# Patient Record
Sex: Female | Born: 1945 | Race: White | Hispanic: No
Health system: Southern US, Community
[De-identification: ages and names within clinical notes are randomized; demographics above are authoritative.]

---

## 2014-01-28 ENCOUNTER — Inpatient Hospital Stay
Admission: AD | Admit: 2014-01-28 | Discharge: 2014-02-19 | Disposition: A | Discharge status: Skilled Nursing Facility | Payer: Self-pay | Source: Ambulatory Visit | Attending: Internal Medicine | Admitting: Internal Medicine

## 2014-01-29 ENCOUNTER — Other Ambulatory Visit (HOSPITAL_COMMUNITY): Payer: Self-pay

## 2014-01-29 LAB — CBC
HCT: 24.6 % — ABNORMAL LOW (ref 36.0–46.0)
Hemoglobin: 8.4 g/dL — ABNORMAL LOW (ref 12.0–15.0)
MCH: 28.1 pg (ref 26.0–34.0)
MCHC: 34.1 g/dL (ref 30.0–36.0)
MCV: 82.3 fL (ref 78.0–100.0)
Platelets: 171 10*3/uL (ref 150–400)
RBC: 2.99 MIL/uL — ABNORMAL LOW (ref 3.87–5.11)
RDW: 14.5 % (ref 11.5–15.5)
WBC: 6.4 10*3/uL (ref 4.0–10.5)

## 2014-01-29 LAB — URINALYSIS, ROUTINE W REFLEX MICROSCOPIC
Bilirubin Urine: NEGATIVE
GLUCOSE, UA: 250 mg/dL — AB
Ketones, ur: 15 mg/dL — AB
Nitrite: NEGATIVE
Protein, ur: NEGATIVE mg/dL
SPECIFIC GRAVITY, URINE: 1.013 (ref 1.005–1.030)
UROBILINOGEN UA: 0.2 mg/dL (ref 0.0–1.0)
pH: 7 (ref 5.0–8.0)

## 2014-01-29 LAB — COMPREHENSIVE METABOLIC PANEL
ALBUMIN: 1.8 g/dL — AB (ref 3.5–5.2)
ALK PHOS: 89 U/L (ref 39–117)
ALT: 8 U/L (ref 0–35)
AST: 14 U/L (ref 0–37)
BUN: 13 mg/dL (ref 6–23)
CO2: 26 mEq/L (ref 19–32)
Calcium: 7.9 mg/dL — ABNORMAL LOW (ref 8.4–10.5)
Chloride: 105 mEq/L (ref 96–112)
Creatinine, Ser: 0.49 mg/dL — ABNORMAL LOW (ref 0.50–1.10)
GFR calc Af Amer: >90 mL/min (ref >90)
GFR calc non Af Amer: >90 mL/min (ref >90)
Glucose, Bld: 234 mg/dL — ABNORMAL HIGH (ref 70–99)
POTASSIUM: 3.6 meq/L — AB (ref 3.7–5.3)
SODIUM: 141 meq/L (ref 137–147)
TOTAL PROTEIN: 6.1 g/dL (ref 6.0–8.3)
Total Bilirubin: 0.3 mg/dL (ref 0.3–1.2)

## 2014-01-29 LAB — PREALBUMIN: Prealbumin: 8 mg/dL — ABNORMAL LOW (ref 17.0–34.0)

## 2014-01-29 LAB — URINE MICROSCOPIC-ADD ON

## 2014-01-29 LAB — TSH: TSH: 3.76 u[IU]/mL (ref 0.350–4.500)

## 2014-01-31 LAB — URINALYSIS, ROUTINE W REFLEX MICROSCOPIC
Bilirubin Urine: NEGATIVE
GLUCOSE, UA: 250 mg/dL — AB
KETONES UR: NEGATIVE mg/dL
Nitrite: NEGATIVE
PROTEIN: 30 mg/dL — AB
Specific Gravity, Urine: 1.014 (ref 1.005–1.030)
Urobilinogen, UA: 1 mg/dL (ref 0.0–1.0)
pH: 6.5 (ref 5.0–8.0)

## 2014-01-31 LAB — URINE MICROSCOPIC-ADD ON

## 2014-02-01 LAB — BASIC METABOLIC PANEL
BUN: 11 mg/dL (ref 6–23)
CHLORIDE: 98 meq/L (ref 96–112)
CO2: 26 meq/L (ref 19–32)
Calcium: 7.7 mg/dL — ABNORMAL LOW (ref 8.4–10.5)
Creatinine, Ser: 0.43 mg/dL — ABNORMAL LOW (ref 0.50–1.10)
GFR calc Af Amer: >90 mL/min (ref >90)
GFR calc non Af Amer: >90 mL/min (ref >90)
Glucose, Bld: 338 mg/dL — ABNORMAL HIGH (ref 70–99)
POTASSIUM: 3.1 meq/L — AB (ref 3.7–5.3)
SODIUM: 135 meq/L — AB (ref 137–147)

## 2014-02-01 LAB — CBC
HCT: 26.9 % — ABNORMAL LOW (ref 36.0–46.0)
Hemoglobin: 9 g/dL — ABNORMAL LOW (ref 12.0–15.0)
MCH: 28 pg (ref 26.0–34.0)
MCHC: 33.5 g/dL (ref 30.0–36.0)
MCV: 83.5 fL (ref 78.0–100.0)
PLATELETS: 193 10*3/uL (ref 150–400)
RBC: 3.22 MIL/uL — ABNORMAL LOW (ref 3.87–5.11)
RDW: 14.9 % (ref 11.5–15.5)
WBC: 4.2 10*3/uL (ref 4.0–10.5)

## 2014-02-01 LAB — URINE CULTURE: Colony Count: >=100000

## 2014-02-02 ENCOUNTER — Other Ambulatory Visit (HOSPITAL_COMMUNITY): Payer: Self-pay

## 2014-02-02 LAB — BASIC METABOLIC PANEL
BUN: 10 mg/dL (ref 6–23)
CO2: 28 mEq/L (ref 19–32)
Calcium: 7.8 mg/dL — ABNORMAL LOW (ref 8.4–10.5)
Chloride: 102 mEq/L (ref 96–112)
Creatinine, Ser: 0.4 mg/dL — ABNORMAL LOW (ref 0.50–1.10)
GLUCOSE: 268 mg/dL — AB (ref 70–99)
POTASSIUM: 3 meq/L — AB (ref 3.7–5.3)
Sodium: 140 mEq/L (ref 137–147)

## 2014-02-03 LAB — CBC
HCT: 24.9 % — ABNORMAL LOW (ref 36.0–46.0)
Hemoglobin: 8.4 g/dL — ABNORMAL LOW (ref 12.0–15.0)
MCH: 28.4 pg (ref 26.0–34.0)
MCHC: 33.7 g/dL (ref 30.0–36.0)
MCV: 84.1 fL (ref 78.0–100.0)
Platelets: 179 10*3/uL (ref 150–400)
RBC: 2.96 MIL/uL — AB (ref 3.87–5.11)
RDW: 15.7 % — ABNORMAL HIGH (ref 11.5–15.5)
WBC: 4.1 10*3/uL (ref 4.0–10.5)

## 2014-02-03 LAB — PHOSPHORUS: PHOSPHORUS: 2.7 mg/dL (ref 2.3–4.6)

## 2014-02-03 LAB — BASIC METABOLIC PANEL
BUN: 10 mg/dL (ref 6–23)
CO2: 28 meq/L (ref 19–32)
Calcium: 8 mg/dL — ABNORMAL LOW (ref 8.4–10.5)
Chloride: 103 mEq/L (ref 96–112)
Creatinine, Ser: 0.39 mg/dL — ABNORMAL LOW (ref 0.50–1.10)
GFR calc Af Amer: >90 mL/min (ref >90)
GFR calc non Af Amer: >90 mL/min (ref >90)
Glucose, Bld: 258 mg/dL — ABNORMAL HIGH (ref 70–99)
POTASSIUM: 3.8 meq/L (ref 3.7–5.3)
SODIUM: 140 meq/L (ref 137–147)

## 2014-02-03 LAB — MAGNESIUM: Magnesium: 1.7 mg/dL (ref 1.5–2.5)

## 2014-02-05 LAB — CBC WITH DIFFERENTIAL/PLATELET
BASOS ABS: 0 10*3/uL (ref 0.0–0.1)
BASOS PCT: 0 % (ref 0–1)
Eosinophils Absolute: 0.1 10*3/uL (ref 0.0–0.7)
Eosinophils Relative: 1 % (ref 0–5)
HEMATOCRIT: 27.4 % — AB (ref 36.0–46.0)
HEMOGLOBIN: 8.9 g/dL — AB (ref 12.0–15.0)
Lymphocytes Relative: 27 % (ref 12–46)
Lymphs Abs: 1.9 10*3/uL (ref 0.7–4.0)
MCH: 27.7 pg (ref 26.0–34.0)
MCHC: 32.5 g/dL (ref 30.0–36.0)
MCV: 85.4 fL (ref 78.0–100.0)
MONOS PCT: 7 % (ref 3–12)
Monocytes Absolute: 0.5 10*3/uL (ref 0.1–1.0)
NEUTROS ABS: 4.4 10*3/uL (ref 1.7–7.7)
Neutrophils Relative %: 65 % (ref 43–77)
Platelets: 185 10*3/uL (ref 150–400)
RBC: 3.21 MIL/uL — ABNORMAL LOW (ref 3.87–5.11)
RDW: 15.8 % — ABNORMAL HIGH (ref 11.5–15.5)
WBC: 6.9 10*3/uL (ref 4.0–10.5)

## 2014-02-05 LAB — URINALYSIS, ROUTINE W REFLEX MICROSCOPIC
Bilirubin Urine: NEGATIVE
GLUCOSE, UA: NEGATIVE mg/dL
Ketones, ur: NEGATIVE mg/dL
Nitrite: NEGATIVE
PROTEIN: 30 mg/dL — AB
SPECIFIC GRAVITY, URINE: 1.014 (ref 1.005–1.030)
Urobilinogen, UA: 1 mg/dL (ref 0.0–1.0)
pH: 8 (ref 5.0–8.0)

## 2014-02-05 LAB — BASIC METABOLIC PANEL
BUN: 14 mg/dL (ref 6–23)
CO2: 27 meq/L (ref 19–32)
Calcium: 8.4 mg/dL (ref 8.4–10.5)
Chloride: 100 mEq/L (ref 96–112)
Creatinine, Ser: 0.42 mg/dL — ABNORMAL LOW (ref 0.50–1.10)
GFR calc Af Amer: >90 mL/min (ref >90)
GLUCOSE: 203 mg/dL — AB (ref 70–99)
POTASSIUM: 3.2 meq/L — AB (ref 3.7–5.3)
Sodium: 138 mEq/L (ref 137–147)

## 2014-02-05 LAB — URINE MICROSCOPIC-ADD ON

## 2014-02-05 LAB — PREALBUMIN: Prealbumin: 12.7 mg/dL — ABNORMAL LOW (ref 17.0–34.0)

## 2014-02-06 ENCOUNTER — Other Ambulatory Visit (HOSPITAL_COMMUNITY): Payer: Self-pay

## 2014-02-06 LAB — CULTURE, BLOOD (ROUTINE X 2)
CULTURE: NO GROWTH
Culture: NO GROWTH

## 2014-02-06 LAB — BASIC METABOLIC PANEL
BUN: 17 mg/dL (ref 6–23)
CALCIUM: 8.5 mg/dL (ref 8.4–10.5)
CO2: 25 mEq/L (ref 19–32)
Chloride: 99 mEq/L (ref 96–112)
Creatinine, Ser: 0.36 mg/dL — ABNORMAL LOW (ref 0.50–1.10)
GFR calc Af Amer: >90 mL/min (ref >90)
Glucose, Bld: 180 mg/dL — ABNORMAL HIGH (ref 70–99)
Potassium: 4.4 mEq/L (ref 3.7–5.3)
SODIUM: 136 meq/L — AB (ref 137–147)

## 2014-02-07 LAB — URINE MICROSCOPIC-ADD ON

## 2014-02-07 LAB — BASIC METABOLIC PANEL
BUN: 20 mg/dL (ref 6–23)
CALCIUM: 8.6 mg/dL (ref 8.4–10.5)
CO2: 27 mEq/L (ref 19–32)
Chloride: 99 mEq/L (ref 96–112)
Creatinine, Ser: 0.43 mg/dL — ABNORMAL LOW (ref 0.50–1.10)
GFR calc Af Amer: >90 mL/min (ref >90)
Glucose, Bld: 120 mg/dL — ABNORMAL HIGH (ref 70–99)
Potassium: 4 mEq/L (ref 3.7–5.3)
SODIUM: 136 meq/L — AB (ref 137–147)

## 2014-02-07 LAB — CBC WITH DIFFERENTIAL/PLATELET
BASOS ABS: 0 10*3/uL (ref 0.0–0.1)
Basophils Relative: 0 % (ref 0–1)
EOS ABS: 0.1 10*3/uL (ref 0.0–0.7)
EOS PCT: 1 % (ref 0–5)
HCT: 26 % — ABNORMAL LOW (ref 36.0–46.0)
Hemoglobin: 8.6 g/dL — ABNORMAL LOW (ref 12.0–15.0)
Lymphocytes Relative: 23 % (ref 12–46)
Lymphs Abs: 2.2 10*3/uL (ref 0.7–4.0)
MCH: 28.1 pg (ref 26.0–34.0)
MCHC: 33.1 g/dL (ref 30.0–36.0)
MCV: 85 fL (ref 78.0–100.0)
Monocytes Absolute: 1 10*3/uL (ref 0.1–1.0)
Monocytes Relative: 11 % (ref 3–12)
Neutro Abs: 6 10*3/uL (ref 1.7–7.7)
Neutrophils Relative %: 65 % (ref 43–77)
PLATELETS: 212 10*3/uL (ref 150–400)
RBC: 3.06 MIL/uL — ABNORMAL LOW (ref 3.87–5.11)
RDW: 16.2 % — ABNORMAL HIGH (ref 11.5–15.5)
WBC: 9.3 10*3/uL (ref 4.0–10.5)

## 2014-02-07 LAB — URINALYSIS, ROUTINE W REFLEX MICROSCOPIC
Bilirubin Urine: NEGATIVE
Glucose, UA: NEGATIVE mg/dL
Ketones, ur: NEGATIVE mg/dL
Nitrite: NEGATIVE
PROTEIN: 30 mg/dL — AB
Specific Gravity, Urine: 1.016 (ref 1.005–1.030)
Urobilinogen, UA: 1 mg/dL (ref 0.0–1.0)
pH: 6.5 (ref 5.0–8.0)

## 2014-02-08 LAB — URINE CULTURE
Colony Count: NO GROWTH
Culture: NO GROWTH

## 2014-02-11 LAB — CBC
HCT: 23.6 % — ABNORMAL LOW (ref 36.0–46.0)
Hemoglobin: 7.9 g/dL — ABNORMAL LOW (ref 12.0–15.0)
MCH: 28.1 pg (ref 26.0–34.0)
MCHC: 33.5 g/dL (ref 30.0–36.0)
MCV: 84 fL (ref 78.0–100.0)
PLATELETS: 291 10*3/uL (ref 150–400)
RBC: 2.81 MIL/uL — ABNORMAL LOW (ref 3.87–5.11)
RDW: 15.1 % (ref 11.5–15.5)
WBC: 7.9 10*3/uL (ref 4.0–10.5)

## 2014-02-11 LAB — COMPREHENSIVE METABOLIC PANEL
ALT: 21 U/L (ref 0–35)
AST: 32 U/L (ref 0–37)
Albumin: 1.9 g/dL — ABNORMAL LOW (ref 3.5–5.2)
Alkaline Phosphatase: 163 U/L — ABNORMAL HIGH (ref 39–117)
BILIRUBIN TOTAL: 0.3 mg/dL (ref 0.3–1.2)
BUN: 20 mg/dL (ref 6–23)
CALCIUM: 8.7 mg/dL (ref 8.4–10.5)
CHLORIDE: 96 meq/L (ref 96–112)
CO2: 26 mEq/L (ref 19–32)
Creatinine, Ser: 0.45 mg/dL — ABNORMAL LOW (ref 0.50–1.10)
GFR calc Af Amer: >90 mL/min (ref >90)
GFR calc non Af Amer: >90 mL/min (ref >90)
Glucose, Bld: 169 mg/dL — ABNORMAL HIGH (ref 70–99)
Potassium: 3.7 mEq/L (ref 3.7–5.3)
Sodium: 134 mEq/L — ABNORMAL LOW (ref 137–147)
Total Protein: 7.8 g/dL (ref 6.0–8.3)

## 2014-02-11 LAB — PROTIME-INR
INR: 1.24 (ref 0.00–1.49)
PROTHROMBIN TIME: 15.3 s — AB (ref 11.6–15.2)

## 2014-02-11 LAB — PREALBUMIN: Prealbumin: 9.4 mg/dL — ABNORMAL LOW (ref 17.0–34.0)

## 2014-02-11 NOTE — H&P (Signed)
Elizabeth Weber is an 68 y.o. female.   Chief Complaint: Pt with hx DM/ ketoacidosis and admitted to [name] after suffering new CVA 01/22/2014 Hx CVA 2014 Recurrent UTIs; renal stones Now with dysphagia and malnutrition- long term care Request consult for percutaneous gastric tube placement Case has been reviewed pt examined. Scheduled now for G tube in IR 6/2  On Xarelto post CVA HOLD 6/1   HPI: DM; ketoacidosis; renal stones; CVA 2014 and 01/22/2014; dysphagia; malnutrition  No past medical history on file.  No past surgical history on file.  No family history on file. Social History:  has no tobacco, alcohol, and drug history on file.  Allergies: Allergies not on file  No prescriptions prior to admission    Results for orders placed during the hospital encounter of 01/28/14 (from the past 48 hour(s))  CBC     Status: Abnormal   Collection Time    02/11/14  7:10 AM      Result Value Ref Range   WBC 7.9  4.0 - 10.5 K/uL   RBC 2.81 (*) 3.87 - 5.11 MIL/uL   Hemoglobin 7.9 (*) 12.0 - 15.0 g/dL   HCT 23.6 (*) 36.0 - 46.0 %   MCV 84.0  78.0 - 100.0 fL   MCH 28.1  26.0 - 34.0 pg   MCHC 33.5  30.0 - 36.0 g/dL   RDW 15.1  11.5 - 15.5 %   Platelets 291  150 - 400 K/uL  COMPREHENSIVE METABOLIC PANEL     Status: Abnormal   Collection Time    02/11/14  7:10 AM      Result Value Ref Range   Sodium 134 (*) 137 - 147 mEq/L   Potassium 3.7  3.7 - 5.3 mEq/L   Chloride 96  96 - 112 mEq/L   CO2 26  19 - 32 mEq/L   Glucose, Bld 169 (*) 70 - 99 mg/dL   BUN 20  6 - 23 mg/dL   Creatinine, Ser 0.45 (*) 0.50 - 1.10 mg/dL   Calcium 8.7  8.4 - 10.5 mg/dL   Total Protein 7.8  6.0 - 8.3 g/dL   Albumin 1.9 (*) 3.5 - 5.2 g/dL   AST 32  0 - 37 U/L   ALT 21  0 - 35 U/L   Alkaline Phosphatase 163 (*) 39 - 117 U/L   Total Bilirubin 0.3  0.3 - 1.2 mg/dL   GFR calc non Af Amer >90  >90 mL/min   GFR calc Af Amer >90  >90 mL/min   Comment: (NOTE)     The eGFR has  been calculated using the CKD EPI equation.     This calculation has not been validated in all clinical situations.     eGFR's persistently <90 mL/min signify possible Chronic Kidney     Disease.  PROTIME-INR     Status: Abnormal   Collection Time    02/11/14  7:10 AM      Result Value Ref Range   Prothrombin Time 15.3 (*) 11.6 - 15.2 seconds   INR 1.24  0.00 - 1.49   No results found.  Review of Systems  Constitutional: Positive for weight loss. Negative for fever.  Respiratory: Negative for cough and shortness of breath.   Gastrointestinal: Negative for nausea, vomiting and abdominal pain.  Neurological: Positive for weakness.    There were no vitals taken for this visit. Physical Exam  Constitutional: She is oriented to person, place, and time.  Cardiovascular: Normal rate.   No murmur heard. Respiratory: Effort normal. She has no wheezes.  GI: There is no tenderness.  Musculoskeletal: Normal range of motion.  Neurological: She is alert and oriented to person, place, and time.  Skin: Skin is warm and dry.  Psychiatric: She has a normal mood and affect. Her behavior is normal. Thought content normal.  Pt is oriented name/dob/place---but too weak to sign name on consent--she is agreeable to procedure Consented son via phone     Assessment/Plan Recent new CVA 01/22/2014 Dysphagia; malnutrition; need for long term care Scheduled now for percutaneous gastric tube placement in IR 6/2 Held Xarelto 6/1 Check kub Barium ordered Labs checked Consent signed with son via phone- in chart  Elizabeth Weber 02/11/2014, 1:48 PM

## 2014-02-12 ENCOUNTER — Other Ambulatory Visit (HOSPITAL_COMMUNITY): Payer: Self-pay

## 2014-02-12 LAB — HEMOGLOBIN AND HEMATOCRIT, BLOOD
HCT: 25 % — ABNORMAL LOW (ref 36.0–46.0)
Hemoglobin: 8.4 g/dL — ABNORMAL LOW (ref 12.0–15.0)

## 2014-02-12 MED ORDER — IOHEXOL 300 MG/ML  SOLN
50.0000 mL | Freq: Once | INTRAMUSCULAR | Status: AC | PRN
  Administered 2014-02-12: 20 mL via INTRAVENOUS

## 2014-02-12 MED ORDER — FENTANYL CITRATE 0.05 MG/ML IJ SOLN
INTRAMUSCULAR | Status: AC
  Filled 2014-02-12: qty 4

## 2014-02-12 MED ORDER — FENTANYL CITRATE 0.05 MG/ML IJ SOLN
INTRAMUSCULAR | Status: AC | PRN
  Administered 2014-02-12: 50 ug via INTRAVENOUS

## 2014-02-12 MED ORDER — MIDAZOLAM HCL 2 MG/2ML IJ SOLN
INTRAMUSCULAR | Status: AC
  Filled 2014-02-12: qty 4

## 2014-02-12 MED ORDER — MIDAZOLAM HCL 2 MG/2ML IJ SOLN
INTRAMUSCULAR | Status: AC | PRN
  Administered 2014-02-12: 1 mg via INTRAVENOUS

## 2014-02-12 NOTE — Procedures (Signed)
Procedure:  Gastrostomy tube Findings:  20 Fr bumper retention gastrostomy placed.  Tip in body of stomach.  OK to use for feeds tomorrow PM.

## 2014-02-13 LAB — BASIC METABOLIC PANEL
BUN: 15 mg/dL (ref 6–23)
CALCIUM: 8.7 mg/dL (ref 8.4–10.5)
CO2: 24 meq/L (ref 19–32)
CREATININE: 0.38 mg/dL — AB (ref 0.50–1.10)
Chloride: 100 mEq/L (ref 96–112)
GFR calc Af Amer: >90 mL/min (ref >90)
GFR calc non Af Amer: >90 mL/min (ref >90)
GLUCOSE: 197 mg/dL — AB (ref 70–99)
Potassium: 3.2 mEq/L — ABNORMAL LOW (ref 3.7–5.3)
Sodium: 137 mEq/L (ref 137–147)

## 2014-02-13 LAB — CBC WITH DIFFERENTIAL/PLATELET
BASOS ABS: 0 10*3/uL (ref 0.0–0.1)
Basophils Relative: 0 % (ref 0–1)
EOS PCT: 1 % (ref 0–5)
Eosinophils Absolute: 0.1 10*3/uL (ref 0.0–0.7)
HEMATOCRIT: 24.7 % — AB (ref 36.0–46.0)
HEMOGLOBIN: 8.2 g/dL — AB (ref 12.0–15.0)
LYMPHS ABS: 1.6 10*3/uL (ref 0.7–4.0)
LYMPHS PCT: 16 % (ref 12–46)
MCH: 28 pg (ref 26.0–34.0)
MCHC: 33.2 g/dL (ref 30.0–36.0)
MCV: 84.3 fL (ref 78.0–100.0)
MONO ABS: 0.6 10*3/uL (ref 0.1–1.0)
MONOS PCT: 6 % (ref 3–12)
Neutro Abs: 8 10*3/uL — ABNORMAL HIGH (ref 1.7–7.7)
Neutrophils Relative %: 77 % (ref 43–77)
Platelets: 329 10*3/uL (ref 150–400)
RBC: 2.93 MIL/uL — ABNORMAL LOW (ref 3.87–5.11)
RDW: 15.1 % (ref 11.5–15.5)
WBC: 10.4 10*3/uL (ref 4.0–10.5)

## 2014-02-14 LAB — POTASSIUM: Potassium: 3.6 mEq/L — ABNORMAL LOW (ref 3.7–5.3)

## 2014-02-15 LAB — BASIC METABOLIC PANEL
BUN: 17 mg/dL (ref 6–23)
CO2: 26 mEq/L (ref 19–32)
CREATININE: 0.45 mg/dL — AB (ref 0.50–1.10)
Calcium: 8.9 mg/dL (ref 8.4–10.5)
Chloride: 100 mEq/L (ref 96–112)
Glucose, Bld: 161 mg/dL — ABNORMAL HIGH (ref 70–99)
Potassium: 3.7 mEq/L (ref 3.7–5.3)
Sodium: 137 mEq/L (ref 137–147)

## 2014-02-15 LAB — CBC
HCT: 26.8 % — ABNORMAL LOW (ref 36.0–46.0)
Hemoglobin: 8.8 g/dL — ABNORMAL LOW (ref 12.0–15.0)
MCH: 27.9 pg (ref 26.0–34.0)
MCHC: 32.8 g/dL (ref 30.0–36.0)
MCV: 85.1 fL (ref 78.0–100.0)
Platelets: 371 10*3/uL (ref 150–400)
RBC: 3.15 MIL/uL — ABNORMAL LOW (ref 3.87–5.11)
RDW: 15.6 % — AB (ref 11.5–15.5)
WBC: 8.8 10*3/uL (ref 4.0–10.5)

## 2014-02-19 LAB — BASIC METABOLIC PANEL
BUN: 15 mg/dL (ref 6–23)
CALCIUM: 9 mg/dL (ref 8.4–10.5)
CO2: 30 mEq/L (ref 19–32)
Chloride: 96 mEq/L (ref 96–112)
Creatinine, Ser: 0.41 mg/dL — ABNORMAL LOW (ref 0.50–1.10)
GFR calc Af Amer: >90 mL/min (ref >90)
Glucose, Bld: 241 mg/dL — ABNORMAL HIGH (ref 70–99)
Potassium: 4.1 mEq/L (ref 3.7–5.3)
SODIUM: 134 meq/L — AB (ref 137–147)

## 2014-02-19 LAB — CBC
HCT: 26.1 % — ABNORMAL LOW (ref 36.0–46.0)
Hemoglobin: 8.5 g/dL — ABNORMAL LOW (ref 12.0–15.0)
MCH: 28 pg (ref 26.0–34.0)
MCHC: 32.6 g/dL (ref 30.0–36.0)
MCV: 85.9 fL (ref 78.0–100.0)
Platelets: 283 10*3/uL (ref 150–400)
RBC: 3.04 MIL/uL — ABNORMAL LOW (ref 3.87–5.11)
RDW: 16.2 % — ABNORMAL HIGH (ref 11.5–15.5)
WBC: 7.4 10*3/uL (ref 4.0–10.5)

## 2014-07-21 IMAGING — CR DG ABD PORTABLE 1V
1 series · 1 of 1 positions shown · non-contrast
Comparison: 01/29/2014

CLINICAL DATA: NG tube placement

EXAM:
PORTABLE ABDOMEN - 1 VIEW

[AP]
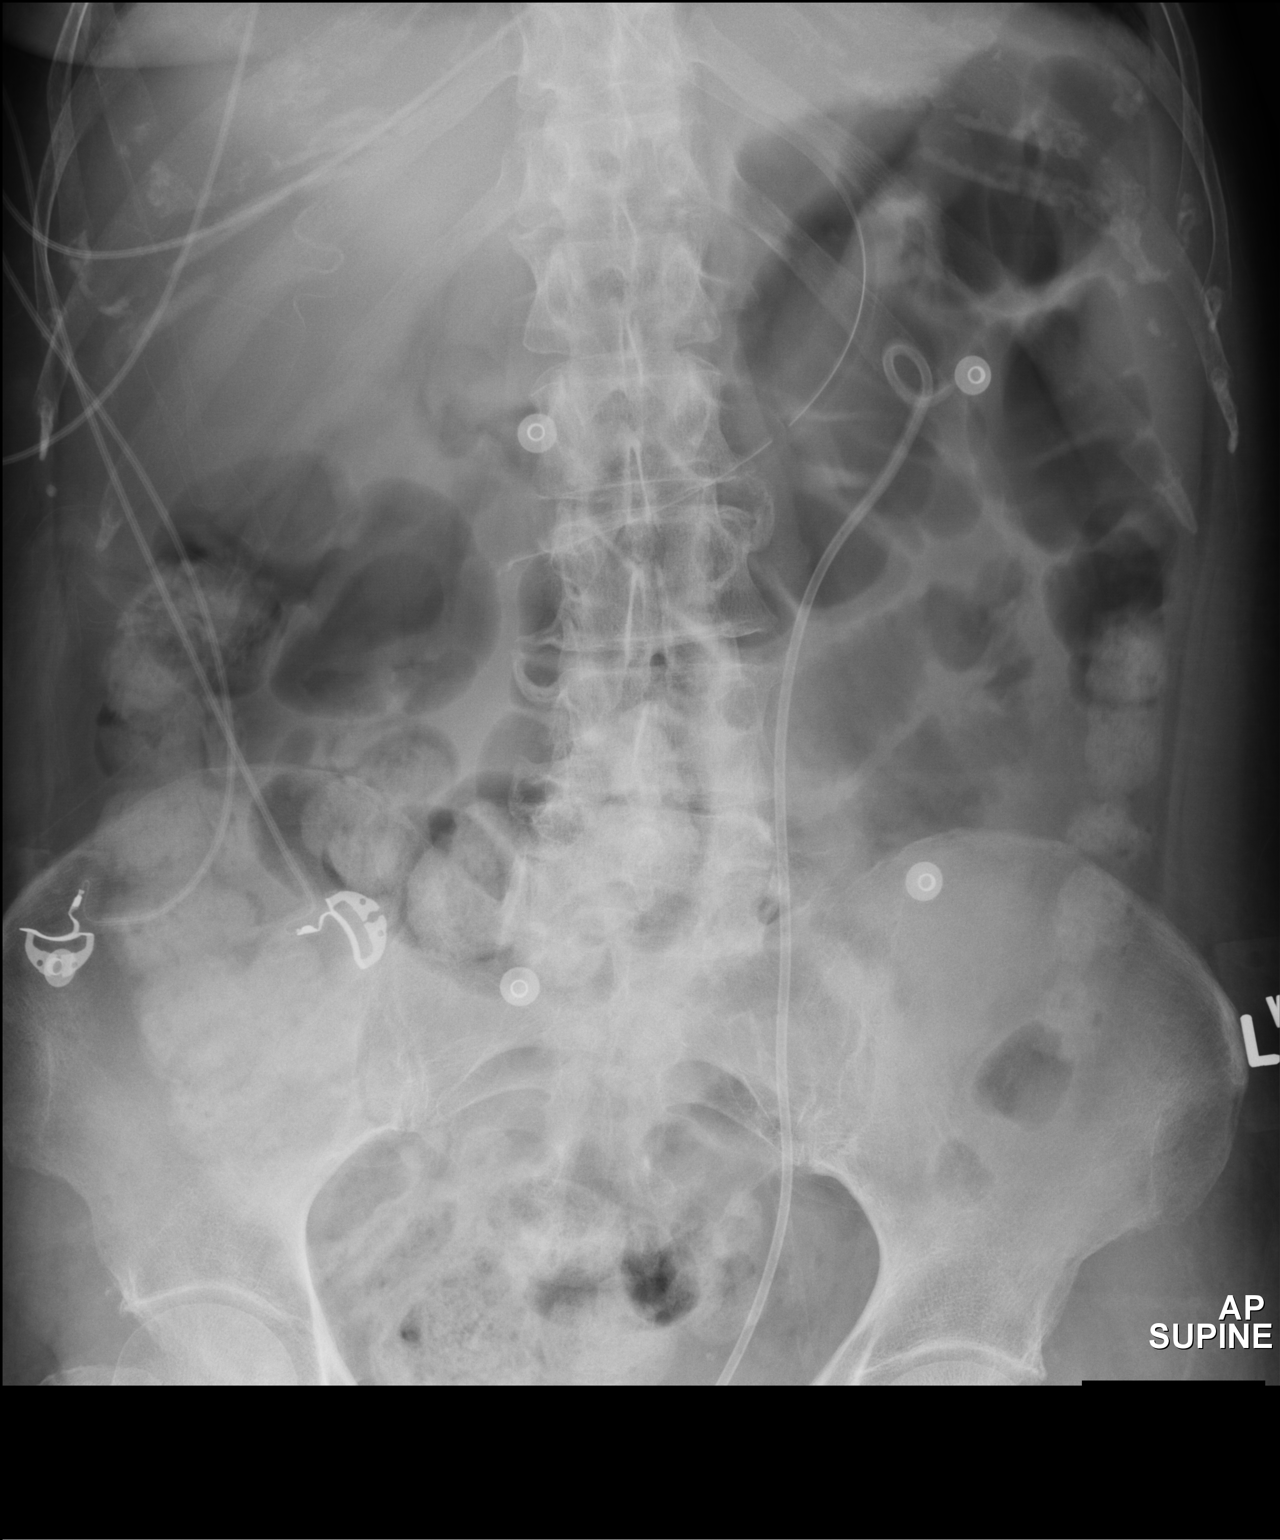

[1 of 1 positions shown; findings below may reference images not displayed]

FINDINGS: Nasogastric tube tip lies in the distal stomach.

Left-sided ureteral stent is stable.

Bowel gas pattern is nonspecific. No obstruction. No significant
adynamic ileus.
IMPRESSION: NG tube well positioned with its tip in the distal stomach. No acute
findings.

## 2014-07-31 IMAGING — CR DG ABD PORTABLE 1V
1 series · 1 of 1 positions shown · non-contrast
Comparison: 02/02/2014.

CLINICAL DATA: Evaluate barium for PEG tube placement.

EXAM:
PORTABLE ABDOMEN - 1 VIEW

[AP]
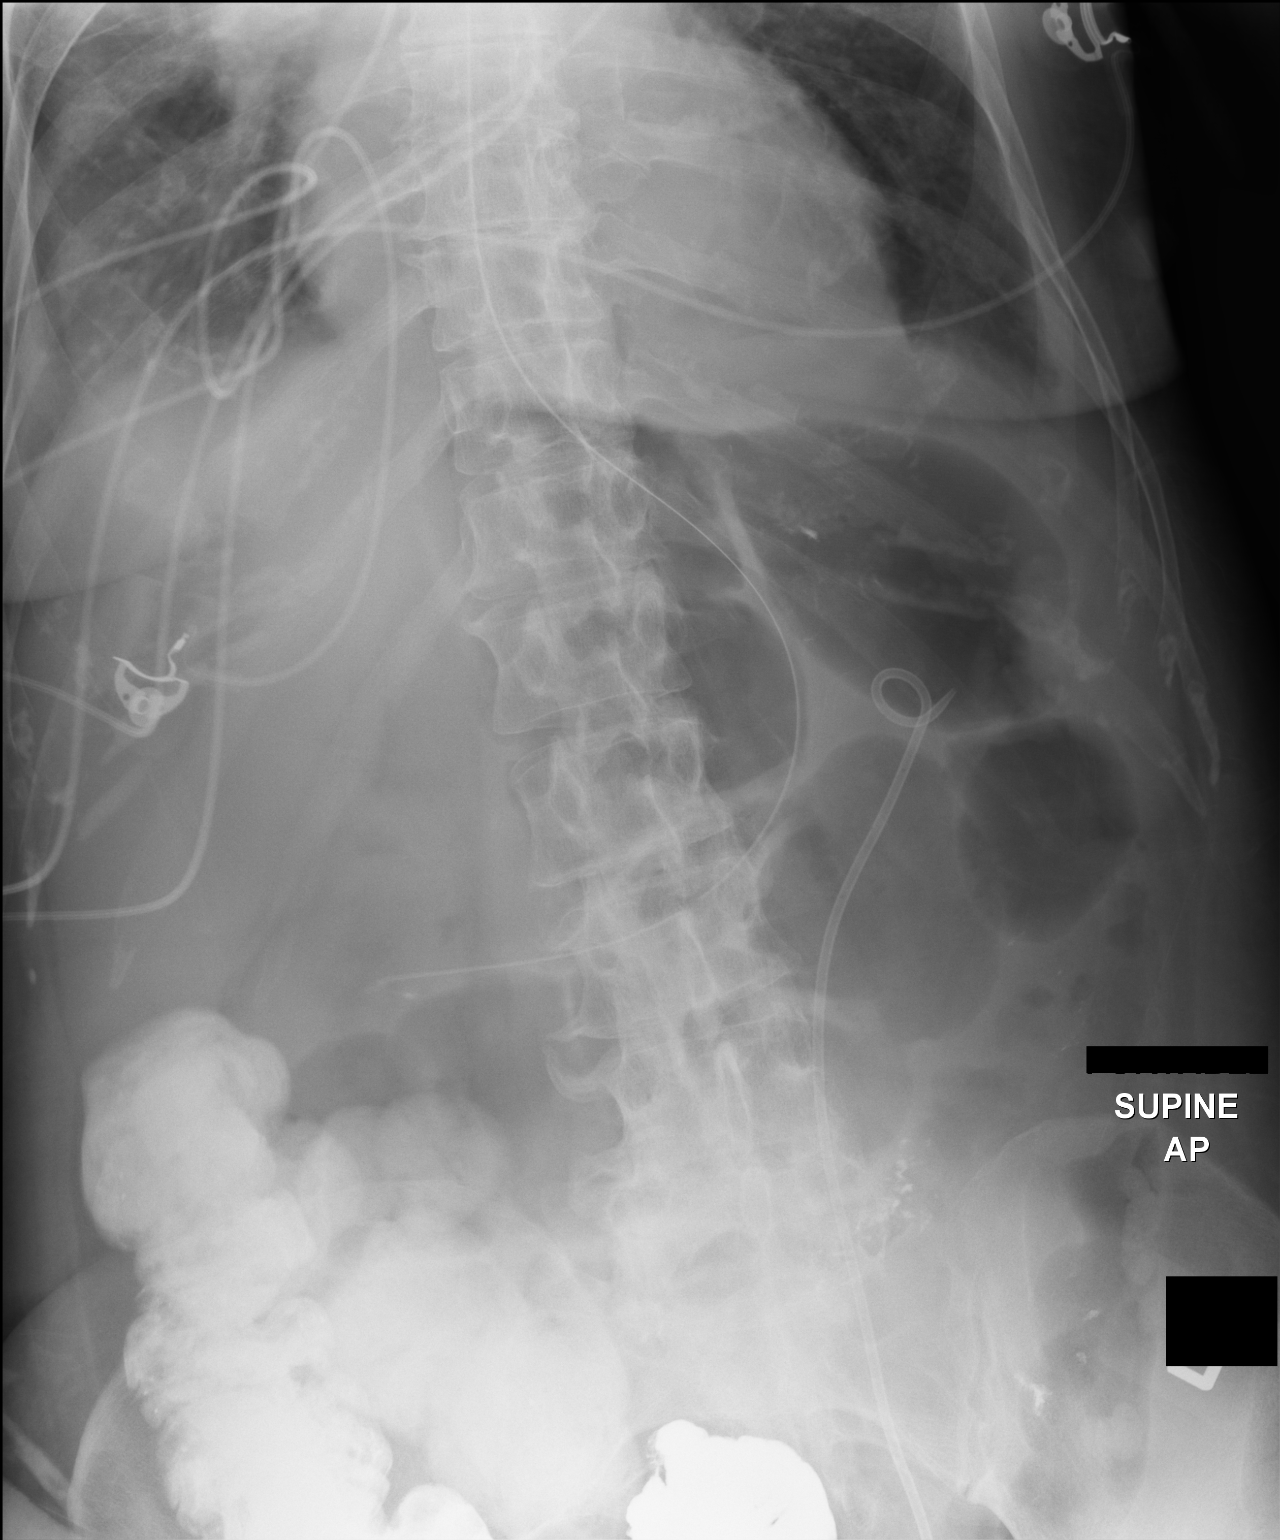

[1 of 1 positions shown; findings below may reference images not displayed]

FINDINGS: NG tube noted in stable position within the stomach. Left double-J
ureteral stent noted in stable position. Barium is noted throughout
the colon. No bowel distention or free air. Basilar atelectasis.
IMPRESSION: 1. NG tube and left ureteral double-J stent in stable position.
2. Barium noted throughout the colon.  No bowel distention.

## 2014-11-12 DEATH — deceased
